# Patient Record
Sex: Male | Born: 1985 | Race: White | Hispanic: No | Marital: Single | Smoking: Former smoker
Health system: Southern US, Community
[De-identification: ages and names within clinical notes are randomized; demographics above are authoritative.]

## PROBLEM LIST (undated history)

## (undated) DIAGNOSIS — A630 Anogenital (venereal) warts: Secondary | ICD-10-CM

## (undated) DIAGNOSIS — F1911 Other psychoactive substance abuse, in remission: Secondary | ICD-10-CM

## (undated) DIAGNOSIS — B1920 Unspecified viral hepatitis C without hepatic coma: Secondary | ICD-10-CM

---

## 2011-08-31 HISTORY — PX: PERCUTANEOUS PINNING WRIST FRACTURE: SHX2211

## 2016-11-24 DIAGNOSIS — Z1159 Encounter for screening for other viral diseases: Secondary | ICD-10-CM | POA: Diagnosis not present

## 2016-11-24 DIAGNOSIS — R221 Localized swelling, mass and lump, neck: Secondary | ICD-10-CM | POA: Diagnosis not present

## 2016-12-16 DIAGNOSIS — R768 Other specified abnormal immunological findings in serum: Secondary | ICD-10-CM | POA: Diagnosis not present

## 2016-12-16 DIAGNOSIS — Z87898 Personal history of other specified conditions: Secondary | ICD-10-CM | POA: Diagnosis not present

## 2016-12-20 ENCOUNTER — Other Ambulatory Visit (HOSPITAL_COMMUNITY): Payer: Self-pay | Admitting: Gastroenterology

## 2016-12-20 DIAGNOSIS — B1920 Unspecified viral hepatitis C without hepatic coma: Secondary | ICD-10-CM

## 2017-01-05 ENCOUNTER — Ambulatory Visit (HOSPITAL_COMMUNITY)
Admission: RE | Admit: 2017-01-05 | Discharge: 2017-01-05 | Disposition: A | Discharge status: Home / Self Care | Payer: 59 | Source: Ambulatory Visit | Attending: Gastroenterology | Admitting: Gastroenterology

## 2017-01-05 DIAGNOSIS — R768 Other specified abnormal immunological findings in serum: Secondary | ICD-10-CM | POA: Insufficient documentation

## 2017-01-05 DIAGNOSIS — B1920 Unspecified viral hepatitis C without hepatic coma: Secondary | ICD-10-CM | POA: Diagnosis present

## 2017-01-05 DIAGNOSIS — R935 Abnormal findings on diagnostic imaging of other abdominal regions, including retroperitoneum: Secondary | ICD-10-CM | POA: Diagnosis not present

## 2017-01-12 DIAGNOSIS — Z87898 Personal history of other specified conditions: Secondary | ICD-10-CM | POA: Diagnosis not present

## 2017-01-27 ENCOUNTER — Other Ambulatory Visit: Payer: Self-pay | Admitting: General Surgery

## 2017-02-09 ENCOUNTER — Other Ambulatory Visit: Payer: Self-pay | Admitting: Urology

## 2017-02-28 ENCOUNTER — Encounter (HOSPITAL_BASED_OUTPATIENT_CLINIC_OR_DEPARTMENT_OTHER): Payer: Self-pay | Admitting: *Deleted

## 2017-02-28 NOTE — Progress Notes (Signed)
NPO AFTER MN.  ARRIVE AT 0600.  NEEDS HG.  

## 2017-03-11 ENCOUNTER — Encounter (HOSPITAL_BASED_OUTPATIENT_CLINIC_OR_DEPARTMENT_OTHER)
Admission: RE | Disposition: A | Discharge status: Home / Self Care | Payer: Self-pay | Source: Ambulatory Visit | Attending: General Surgery

## 2017-03-11 ENCOUNTER — Ambulatory Visit (HOSPITAL_BASED_OUTPATIENT_CLINIC_OR_DEPARTMENT_OTHER)
Admission: RE | Admit: 2017-03-11 | Discharge: 2017-03-11 | Disposition: A | Discharge status: Home / Self Care | Payer: 59 | Source: Ambulatory Visit | Attending: General Surgery | Admitting: General Surgery

## 2017-03-11 ENCOUNTER — Encounter (HOSPITAL_BASED_OUTPATIENT_CLINIC_OR_DEPARTMENT_OTHER): Payer: Self-pay | Admitting: *Deleted

## 2017-03-11 ENCOUNTER — Ambulatory Visit (HOSPITAL_BASED_OUTPATIENT_CLINIC_OR_DEPARTMENT_OTHER): Payer: 59 | Admitting: Anesthesiology

## 2017-03-11 DIAGNOSIS — K759 Inflammatory liver disease, unspecified: Secondary | ICD-10-CM | POA: Insufficient documentation

## 2017-03-11 DIAGNOSIS — Z833 Family history of diabetes mellitus: Secondary | ICD-10-CM | POA: Diagnosis not present

## 2017-03-11 DIAGNOSIS — A630 Anogenital (venereal) warts: Secondary | ICD-10-CM | POA: Diagnosis present

## 2017-03-11 DIAGNOSIS — Z87891 Personal history of nicotine dependence: Secondary | ICD-10-CM | POA: Insufficient documentation

## 2017-03-11 DIAGNOSIS — Z79899 Other long term (current) drug therapy: Secondary | ICD-10-CM | POA: Diagnosis not present

## 2017-03-11 DIAGNOSIS — Z808 Family history of malignant neoplasm of other organs or systems: Secondary | ICD-10-CM | POA: Diagnosis not present

## 2017-03-11 DIAGNOSIS — E669 Obesity, unspecified: Secondary | ICD-10-CM | POA: Insufficient documentation

## 2017-03-11 DIAGNOSIS — Z6831 Body mass index (BMI) 31.0-31.9, adult: Secondary | ICD-10-CM | POA: Insufficient documentation

## 2017-03-11 HISTORY — DX: Unspecified viral hepatitis C without hepatic coma: B19.20

## 2017-03-11 HISTORY — DX: Other psychoactive substance abuse, in remission: F19.11

## 2017-03-11 HISTORY — PX: CO2 LASER APPLICATION: SHX5778

## 2017-03-11 HISTORY — DX: Anogenital (venereal) warts: A63.0

## 2017-03-11 HISTORY — PX: LASER ABLATION CONDOLAMATA: SHX5941

## 2017-03-11 SURGERY — ABLATION, CONDYLOMA, USING LASER
Anesthesia: General | Site: Rectum

## 2017-03-11 MED ORDER — LACTATED RINGERS IV SOLN
INTRAVENOUS | Status: DC
  Administered 2017-03-11: 07:00:00 via INTRAVENOUS
  Filled 2017-03-11: qty 1000

## 2017-03-11 MED ORDER — SILVER SULFADIAZINE 1 % EX CREA
1.0000 | TOPICAL_CREAM | Freq: Every day | CUTANEOUS | 2 refills | Status: DC
Start: 2017-03-11 — End: 2020-07-23

## 2017-03-11 MED ORDER — MIDAZOLAM HCL 5 MG/5ML IJ SOLN
INTRAMUSCULAR | Status: DC | PRN
  Administered 2017-03-11: 2 mg via INTRAVENOUS

## 2017-03-11 MED ORDER — DEXAMETHASONE SODIUM PHOSPHATE 4 MG/ML IJ SOLN
INTRAMUSCULAR | Status: DC | PRN
  Administered 2017-03-11: 10 mg via INTRAVENOUS

## 2017-03-11 MED ORDER — ARTIFICIAL TEARS OPHTHALMIC OINT
TOPICAL_OINTMENT | OPHTHALMIC | Status: AC
  Filled 2017-03-11: qty 3.5

## 2017-03-11 MED ORDER — PROPOFOL 10 MG/ML IV BOLUS
INTRAVENOUS | Status: DC | PRN
  Administered 2017-03-11: 20 mg via INTRAVENOUS
  Administered 2017-03-11: 50 mg via INTRAVENOUS
  Administered 2017-03-11: 30 mg via INTRAVENOUS
  Administered 2017-03-11: 50 mg via INTRAVENOUS
  Administered 2017-03-11: 250 mg via INTRAVENOUS
  Administered 2017-03-11: 50 mg via INTRAVENOUS

## 2017-03-11 MED ORDER — ACETAMINOPHEN 650 MG RE SUPP
650.0000 mg | RECTAL | Status: DC | PRN
  Filled 2017-03-11: qty 1

## 2017-03-11 MED ORDER — MEPERIDINE HCL 25 MG/ML IJ SOLN
6.2500 mg | INTRAMUSCULAR | Status: DC | PRN
  Filled 2017-03-11: qty 1

## 2017-03-11 MED ORDER — HYDROMORPHONE HCL 1 MG/ML IJ SOLN
0.2500 mg | INTRAMUSCULAR | Status: DC | PRN
  Filled 2017-03-11: qty 0.5

## 2017-03-11 MED ORDER — FENTANYL CITRATE (PF) 100 MCG/2ML IJ SOLN
INTRAMUSCULAR | Status: AC
  Filled 2017-03-11: qty 2

## 2017-03-11 MED ORDER — ONDANSETRON HCL 4 MG/2ML IJ SOLN
INTRAMUSCULAR | Status: DC | PRN
  Administered 2017-03-11: 4 mg via INTRAVENOUS

## 2017-03-11 MED ORDER — KETOROLAC TROMETHAMINE 10 MG PO TABS: 10.0000 mg | ORAL_TABLET | Freq: Four times a day (QID) | ORAL | 0 refills | Status: DC | PRN

## 2017-03-11 MED ORDER — BUPIVACAINE LIPOSOME 1.3 % IJ SUSP
INTRAMUSCULAR | Status: DC | PRN
  Administered 2017-03-11: 20 mL

## 2017-03-11 MED ORDER — CEFAZOLIN SODIUM-DEXTROSE 2-4 GM/100ML-% IV SOLN
INTRAVENOUS | Status: AC
  Filled 2017-03-11: qty 100

## 2017-03-11 MED ORDER — KETOROLAC TROMETHAMINE 30 MG/ML IJ SOLN
INTRAMUSCULAR | Status: AC
  Filled 2017-03-11: qty 1

## 2017-03-11 MED ORDER — LIDOCAINE 2% (20 MG/ML) 5 ML SYRINGE
INTRAMUSCULAR | Status: DC | PRN
  Administered 2017-03-11: 100 mg via INTRAVENOUS

## 2017-03-11 MED ORDER — SODIUM CHLORIDE 0.9% FLUSH
3.0000 mL | Freq: Two times a day (BID) | INTRAVENOUS | Status: DC
  Filled 2017-03-11: qty 3

## 2017-03-11 MED ORDER — ACETAMINOPHEN 10 MG/ML IV SOLN
INTRAVENOUS | Status: DC | PRN
  Administered 2017-03-11: 1000 mg via INTRAVENOUS

## 2017-03-11 MED ORDER — SILVER SULFADIAZINE 1 % EX CREA
TOPICAL_CREAM | CUTANEOUS | Status: DC | PRN
  Administered 2017-03-11: 1 via TOPICAL

## 2017-03-11 MED ORDER — ACETIC ACID 5 % SOLN
Status: DC | PRN
  Administered 2017-03-11 (×3): 1 via TOPICAL

## 2017-03-11 MED ORDER — ACETAMINOPHEN 325 MG PO TABS
650.0000 mg | ORAL_TABLET | ORAL | Status: DC | PRN
  Filled 2017-03-11: qty 2

## 2017-03-11 MED ORDER — ONDANSETRON HCL 4 MG/2ML IJ SOLN
INTRAMUSCULAR | Status: AC
  Filled 2017-03-11: qty 2

## 2017-03-11 MED ORDER — KETOROLAC TROMETHAMINE 30 MG/ML IJ SOLN
30.0000 mg | Freq: Four times a day (QID) | INTRAMUSCULAR | Status: DC
  Filled 2017-03-11: qty 1

## 2017-03-11 MED ORDER — HYDROCODONE-ACETAMINOPHEN 7.5-325 MG PO TABS
1.0000 | ORAL_TABLET | Freq: Once | ORAL | Status: DC | PRN
  Filled 2017-03-11: qty 1

## 2017-03-11 MED ORDER — SODIUM CHLORIDE 0.9% FLUSH
3.0000 mL | INTRAVENOUS | Status: DC | PRN
  Filled 2017-03-11: qty 3

## 2017-03-11 MED ORDER — DEXAMETHASONE SODIUM PHOSPHATE 10 MG/ML IJ SOLN
INTRAMUSCULAR | Status: AC
  Filled 2017-03-11: qty 1

## 2017-03-11 MED ORDER — KETOROLAC TROMETHAMINE 30 MG/ML IJ SOLN
INTRAMUSCULAR | Status: DC | PRN
  Administered 2017-03-11: 30 mg via INTRAVENOUS

## 2017-03-11 MED ORDER — LIDOCAINE 2% (20 MG/ML) 5 ML SYRINGE
INTRAMUSCULAR | Status: AC
  Filled 2017-03-11: qty 5

## 2017-03-11 MED ORDER — MIDAZOLAM HCL 2 MG/2ML IJ SOLN
INTRAMUSCULAR | Status: AC
  Filled 2017-03-11: qty 2

## 2017-03-11 MED ORDER — ACETAMINOPHEN 10 MG/ML IV SOLN
INTRAVENOUS | Status: AC
  Filled 2017-03-11: qty 100

## 2017-03-11 MED ORDER — FENTANYL CITRATE (PF) 100 MCG/2ML IJ SOLN
INTRAMUSCULAR | Status: DC | PRN
Start: 2017-03-11 — End: 2017-03-11
  Administered 2017-03-11 (×4): 25 ug via INTRAVENOUS

## 2017-03-11 MED ORDER — BUPIVACAINE-EPINEPHRINE 0.5% -1:200000 IJ SOLN
INTRAMUSCULAR | Status: DC | PRN
  Administered 2017-03-11: 20 mL

## 2017-03-11 MED ORDER — SODIUM CHLORIDE 0.9 % IR SOLN
Status: DC | PRN
  Administered 2017-03-11: 500 mL

## 2017-03-11 MED ORDER — LIDOCAINE 5 % EX OINT: 1.0000 "application " | TOPICAL_OINTMENT | CUTANEOUS | 2 refills | Status: DC | PRN

## 2017-03-11 MED ORDER — SODIUM CHLORIDE 0.9 % IV SOLN
250.0000 mL | INTRAVENOUS | Status: DC | PRN
  Filled 2017-03-11: qty 250

## 2017-03-11 MED ORDER — STERILE WATER FOR IRRIGATION IR SOLN
Status: DC | PRN
  Administered 2017-03-11 (×2): 500 mL

## 2017-03-11 MED ORDER — CEFAZOLIN SODIUM-DEXTROSE 2-4 GM/100ML-% IV SOLN
2.0000 g | INTRAVENOUS | Status: AC
  Administered 2017-03-11: 2 g via INTRAVENOUS
  Filled 2017-03-11: qty 100

## 2017-03-11 MED ORDER — PROPOFOL 10 MG/ML IV BOLUS
INTRAVENOUS | Status: AC
  Filled 2017-03-11: qty 40

## 2017-03-11 MED ORDER — PROMETHAZINE HCL 25 MG/ML IJ SOLN
6.2500 mg | INTRAMUSCULAR | Status: DC | PRN
  Filled 2017-03-11: qty 1

## 2017-03-11 SURGICAL SUPPLY — 56 items
BLADE HEX COATED 2.75 (ELECTRODE) ×3 IMPLANT
BLADE SURG 15 STRL LF DISP TIS (BLADE) ×2 IMPLANT
BLADE SURG 15 STRL SS (BLADE) ×1
BRIEF STRETCH FOR OB PAD LRG (UNDERPADS AND DIAPERS) ×3 IMPLANT
CANISTER SUCT 3000ML PPV (MISCELLANEOUS) ×3 IMPLANT
COVER BACK TABLE 60X90IN (DRAPES) ×3 IMPLANT
COVER MAYO STAND STRL (DRAPES) IMPLANT
DECANTER SPIKE VIAL GLASS SM (MISCELLANEOUS) ×3 IMPLANT
DRAPE HALF SHEET 40X57 (DRAPES) ×3 IMPLANT
DRAPE LAPAROTOMY 100X72 PEDS (DRAPES) IMPLANT
DRAPE LG THREE QUARTER DISP (DRAPES) IMPLANT
DRAPE UNDER BUTCK 40X44W/POUCH (DRAPES) ×3 IMPLANT
DRAPE UTILITY 15X26 (DRAPE) ×3 IMPLANT
DRAPE UTILITY XL STRL (DRAPES) ×3 IMPLANT
DRSG PAD ABDOMINAL 8X10 ST (GAUZE/BANDAGES/DRESSINGS) ×3 IMPLANT
ELECT REM PT RETURN 9FT ADLT (ELECTROSURGICAL) ×3
ELECTRODE REM PT RTRN 9FT ADLT (ELECTROSURGICAL) ×2 IMPLANT
GAUZE SPONGE 4X4 16PLY XRAY LF (GAUZE/BANDAGES/DRESSINGS) IMPLANT
GAUZE VASELINE 3X9 (GAUZE/BANDAGES/DRESSINGS) IMPLANT
GLOVE BIO SURGEON STRL SZ 6.5 (GLOVE) ×3 IMPLANT
GLOVE BIO SURGEON STRL SZ7 (GLOVE) ×3 IMPLANT
GLOVE BIO SURGEON STRL SZ7.5 (GLOVE) ×3 IMPLANT
GLOVE BIOGEL PI IND STRL 7.0 (GLOVE) ×2 IMPLANT
GLOVE BIOGEL PI INDICATOR 7.0 (GLOVE) ×1
GOWN SPEC L3 XXLG W/TWL (GOWN DISPOSABLE) ×3 IMPLANT
GOWN STRL REUS W/ TWL LRG LVL3 (GOWN DISPOSABLE) ×2 IMPLANT
GOWN STRL REUS W/ TWL XL LVL3 (GOWN DISPOSABLE) ×4 IMPLANT
GOWN STRL REUS W/TWL LRG LVL3 (GOWN DISPOSABLE) ×1
GOWN STRL REUS W/TWL XL LVL3 (GOWN DISPOSABLE) ×5 IMPLANT
KIT RM TURNOVER CYSTO AR (KITS) ×3 IMPLANT
LEGGING LITHOTOMY PAIR STRL (DRAPES) ×3 IMPLANT
MANIFOLD NEPTUNE II (INSTRUMENTS) IMPLANT
NDL SAFETY ECLIPSE 18X1.5 (NEEDLE) IMPLANT
NEEDLE HYPO 18GX1.5 SHARP (NEEDLE)
NEEDLE HYPO 22GX1.5 SAFETY (NEEDLE) ×3 IMPLANT
NEEDLE HYPO 25X1 1.5 SAFETY (NEEDLE) ×3 IMPLANT
NS IRRIG 500ML POUR BTL (IV SOLUTION) ×3 IMPLANT
PACK BASIN DAY SURGERY FS (CUSTOM PROCEDURE TRAY) ×3 IMPLANT
PAD ABD 8X10 STRL (GAUZE/BANDAGES/DRESSINGS) ×3 IMPLANT
PAD ARMBOARD 7.5X6 YLW CONV (MISCELLANEOUS) IMPLANT
PENCIL BUTTON HOLSTER BLD 10FT (ELECTRODE) ×3 IMPLANT
SPONGE GAUZE 4X4 12PLY (GAUZE/BANDAGES/DRESSINGS) IMPLANT
SPONGE SURGIFOAM ABS GEL 12-7 (HEMOSTASIS) IMPLANT
SUT CHROMIC 2 0 SH (SUTURE) IMPLANT
SUT CHROMIC 3 0 SH 27 (SUTURE) IMPLANT
SUT MON AB 3-0 SH 27 (SUTURE)
SUT MON AB 3-0 SH27 (SUTURE) IMPLANT
SUT VIC AB 4-0 P-3 18XBRD (SUTURE) IMPLANT
SUT VIC AB 4-0 P3 18 (SUTURE)
SYR CONTROL 10ML LL (SYRINGE) ×3 IMPLANT
TOWEL OR 17X24 6PK STRL BLUE (TOWEL DISPOSABLE) ×6 IMPLANT
TRAY DSU PREP LF (CUSTOM PROCEDURE TRAY) ×3 IMPLANT
TUBE CONNECTING 12X1/4 (SUCTIONS) ×3 IMPLANT
VACUUM HOSE 7/8X10 W/ WAND (MISCELLANEOUS) ×3 IMPLANT
WATER STERILE IRR 500ML POUR (IV SOLUTION) ×6 IMPLANT
YANKAUER SUCT BULB TIP NO VENT (SUCTIONS) ×3 IMPLANT

## 2017-03-11 NOTE — Op Note (Signed)
03/11/2017  8:36 AM  PATIENT:  Charles Kim  31 y.o. male  Patient Care Team: Pa, [place] as PCP - General (Family Medicine)  PRE-OPERATIVE DIAGNOSIS:  anal condyloma  POST-OPERATIVE DIAGNOSIS:  anal condyloma  PROCEDURE:   LASER ABLATION ANAL CONDYLOMA     Surgeon(s): Leighton Ruff, MD   ASSISTANT: none   ANESTHESIA:   local and MAC  SPECIMEN:  No Specimen  DISPOSITION OF SPECIMEN:  N/A  COUNTS:  YES  PLAN OF CARE: Discharge to home after PACU  PATIENT DISPOSITION:  PACU - hemodynamically stable.  INDICATION: 31 y.o. M with perineal condyloma   OR FINDINGS: carpeting lesions, bilateral perianal region, no internal lesions noted  DESCRIPTION: the patient was identified in the preoperative holding area and taken to the OR where they were laid on the operating room table.  MAC anesthesia was induced without difficulty. The patient was then positioned in prone jackknife position with buttocks gently taped apart.  The patient was then prepped and draped in usual sterile fashion.  SCDs were noted to be in place prior to the initiation of anesthesia. A surgical timeout was performed indicating the correct patient, procedure, positioning and need for preoperative antibiotics.  A rectal block was performed using Marcaine with epinephrine.    I began with a digital rectal exam.  There were no abnormal masses.  I then placed a Hill-Ferguson anoscope into the anal canal and evaluated this completely.  No lesions were noted internally.  The laser was then activated and the patient was appropriately draped.  The laser was used the ablate the condylomatous tissue circumferentially.  I confirmed condyloma with acetic acid.  Once all lesions were ablated, a field block was created using Marcaine and Exparel.   I then turned over the case to Dr Karsten Ro to complete the anterior portion.

## 2017-03-11 NOTE — H&P (Signed)
History of Present Illness  The patient is a 31 year old male who presents with a complaint of anal problems. 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 He reports these have been present for several years dating back to his time in prison. All other STD testing has been negative. He occasionally has some itching but no other sites have been giving him problems. He has had several lesions frozen off in the past by his dermatologist but they're unable to keep up with his extent of disease.   Past Surgical History Timmothy Euler, [place]; 11/22/2016 2:38 PM) No pertinent past surgical history   Diagnostic Studies History Timmothy Euler, [place]; 11/22/2016 2:38 PM) Colonoscopy  never  Allergies Barron Alvine [place], [place]; 11/22/2016 2:39 PM) No Known Drug Allergies 11/22/2016 Allergies Reconciled   Medication History Timmothy Euler, [place]; 11/22/2016 2:39 PM) No Current Medications Medications Reconciled  Social History Timmothy Euler, [place]; 11/22/2016 2:38 PM) Alcohol use  Remotely quit alcohol use. Caffeine use  Coffee. Illicit drug use  Remotely quit drug use. Tobacco use  Former smoker.  Family History Timmothy Euler, [place]; 11/22/2016 2:38 PM) Diabetes Mellitus  Father. Melanoma  Father, Mother.  Other Problems Timmothy Euler, [place]; 11/22/2016 2:38 PM) Hepatitis     Review of Systems  General Not Present- Appetite Loss, Chills, Fatigue, Fever, Night Sweats, Weight Gain and Weight Loss. Skin Not Present- Change in Wart/Mole, Dryness, Hives, Jaundice, New Lesions, Non-Healing Wounds, Rash and Ulcer. HEENT Not Present- Earache, Hearing Loss, Hoarseness, Nose Bleed, Oral Ulcers, Ringing in the Ears, Seasonal Allergies, Sinus Pain, Sore Throat, Visual Disturbances, Wears glasses/contact lenses and Yellow Eyes. Respiratory Not Present- Bloody sputum, Chronic Cough, Difficulty Breathing, Snoring and Wheezing. Breast Not Present- Breast Mass, Breast  Pain, Nipple Discharge and Skin Changes. Cardiovascular Not Present- Chest Pain, Difficulty Breathing Lying Down, Leg Cramps, Palpitations, Rapid Heart Rate, Shortness of Breath and Swelling of Extremities. Gastrointestinal Not Present- Abdominal Pain, Bloating, Bloody Stool, Change in Bowel Habits, Chronic diarrhea, Constipation, Difficulty Swallowing, Excessive gas, Gets full quickly at meals, Hemorrhoids, Indigestion, Nausea, Rectal Pain and Vomiting. Male Genitourinary Not Present- Blood in Urine, Change in Urinary Stream, Frequency, Impotence, Nocturia, Painful Urination, Urgency and Urine Leakage. Neurological Present- Headaches. Not Present- Decreased Memory, Fainting, Numbness, Seizures, Tingling, Tremor, Trouble walking and Weakness. Psychiatric Not Present- Anxiety, Bipolar, Change in Sleep Pattern, Depression, Fearful and Frequent crying. Endocrine Not Present- Cold Intolerance, Excessive Hunger, Hair Changes, Heat Intolerance, Hot flashes and New Diabetes. Hematology Not Present- Blood Thinners, Easy Bruising, Excessive bleeding, Gland problems, HIV and Persistent Infections.  BP 128/68   Pulse 61   Temp 97.8 F (36.6 C) (Oral)   Resp 16   Ht 5\' 8"  (1.727 m)   Wt 92.5 kg (204 lb)   SpO2 98%   BMI 31.02 kg/m     Physical Exam  General Mental Status-Alert. General Appearance-Not in acute distress. Build & Nutrition-Well nourished. Posture-Normal posture. Gait-Normal.  Head and Neck Head-normocephalic, atraumatic with no lesions or palpable masses. Trachea-midline.  Chest and Lung Exam Chest and lung exam reveals -on auscultation, normal breath sounds, no adventitious sounds and normal vocal resonance.  Cardiovascular Cardiovascular examination reveals -normal heart sounds, regular rate and rhythm with no murmurs and no digital clubbing, cyanosis, edema, increased warmth or tenderness.  Abdomen Inspection Inspection of the abdomen reveals - No  Hernias. Palpation/Percussion Palpation and Percussion of the abdomen reveal - Soft, Non Tender, No Rigidity (guarding), No hepatosplenomegaly and No Palpable abdominal masses.  Male Genitourinary Penis -Note: Multiple small condyloma noted around the shaft of the penis. There are larger lesions noted in the left groin and on the mons pubis.   Neurologic Neurologic evaluation reveals -alert and oriented x 3 with no impairment of recent or remote memory, normal attention span and ability to concentrate, normal sensation and normal coordination.  Musculoskeletal Normal Exam - Bilateral-Upper Extremity Strength Normal and Lower Extremity Strength Normal.    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 On exam he has carpeting lesions on the lateral perianal regions. He has no internal disease. He also has several large lesions in the left groin and lesions on the mons pubis. I have had him evaluated by the urologist as well and they have agreed that further surgical treatment needs to be performed on the penile lesions.  We will perform a joint operation.  We discussed the typical postop recovery and pain. We discussed the risk of recurrence.  Pt would like to avoid post op narcotics due to h/o abuse.

## 2017-03-11 NOTE — Op Note (Signed)
PATIENT:  Charles Kim  PRE-OPERATIVE DIAGNOSIS: 1. Left groin condyloma 2. Suprapubic condyloma 3. Penile condyloma 4. Scrotal condyloma  POST-OPERATIVE DIAGNOSIS: Same  PROCEDURE: CO2 laser ablation of all above noted condyloma (15 lesions 5-10 mm, one lesion 40 mm, one lesion 30 mm, one lesion 20 mm)  SURGEON:  Garnett FarmMark C Danne Vasek  INDICATION: Charles Kim is a 31 year old male with extensive suprapubic, groin, penile, scrotal and perianal condyloma. Due to the extensive nature of the lesions he has elected to proceed with CO2 laser ablation of the lesions.  ANESTHESIA:  General  EBL:  Minimal  DRAINS: None  LOCAL MEDICATIONS USED:  1/2% Marcaine  SPECIMEN:  None  Description of procedure: After informed consent the patient was taken to the operating room and placed on the table in a supine position. General anesthesia was then administered. Once fully anesthetized the patient was moved to the dorsal lithotomy position and the genitalia as well as perineum and anus were sterilely prepped and draped in standard fashion. An official timeout was then performed. Dr. Maisie Fushomas treated his perineal and perianal condyloma with the CO2 laser and this will be dictated by her separately.  A second timeout was performed and I proceeded to begin treatment of his condyloma starting with the penile condyloma using the CO2 laser set at 10 W. All lesions on the penis were obliterated with the laser. I then wrapped the penis with 5% acetic acid solution and turned my attention to the scrotum where several lesions were identified and obliterated. The multiple lesions in the suprapubic region were then treated individually and finally my attention was directed to the very large lesions within the thigh crease anteriorly on the left-hand side. I had to increase the laser setting to 20 W initially to treat the large lesions down to the level of the skin and then reduce the power back down to 10 W. I was able to  completely treat all of these visible lesions. I then went back to the penis and scrotum where the acetic acid solution had been present and this did allow me to find several acetowhite lesions which were treated with the laser as well. I injected local anesthetic in the subcutaneous tissue beneath the largest lesions in the left thigh crease. Silvadene cream was then applied to all of the areas treated and a dry dressing and mesh briefs were applied and the patient was awakened and taken to the recovery room in stable and satisfactory condition. He tolerated the procedure well no intraoperative complications.    PLAN OF CARE: Discharge to home after PACU  PATIENT DISPOSITION:  PACU - hemodynamically stable.

## 2017-03-11 NOTE — Addendum Note (Signed)
Addendum  created 03/11/17 1021 by Tyrone NineSauve, Naiomi Musto F, CRNA   Anesthesia Staff edited

## 2017-03-11 NOTE — Discharge Instructions (Addendum)
Post Operative Instructions Following Laser Surgery  Laser treatment of condyloma (warts) is used to vaporize or eliminate the wart.  The laser actually creates a burn effect on the skin to accomplish this.  The following instructions will help in you comfort postoperatively:  First 24 h  Remove the dressing this evening after you return home from the hospital  Sit in a tub or sitz bath of COOL water for 20 minutes every hour while awake.  After the bath, carefully blot the area dry and place a piece of 100% Cotton with Silvadene on it next to the anal opening.  You may sit on a covered ice pack between the baths as needed.    You should have crushed ice in small amounts until you are able to urinate.  After you urinate, you may drink fluids.  It is normal to not urinate for the first few hours after surgery.  If you become uncomfortable, call the office for instructions.   Take your pain medications as prescribed  You may eat whatever you feel like.  Start with a light meal and gradually advance your diet.    Beginning the day after your surgery  Sit in a tub of cool to warm water for 15 minutes at least 3 times a day and after bowel movements  After the bowel movement, clean with wet cotton, Tucks or baby wipes.  Apply Silvadene to a thin piece of cotton and place over the anal opening after your baths.  This will collect any drainage or bleeding.  Drainage is usually a pink to tan color and is normal for the following 2-3 weeks after surgery.  Change to cotton ever 2-3 hours while awake.  Diet  Eat a regular diet.  Avoid foods that may constipate you or give you diarrhea.  Avoid foods with seeds, nuts, corn or popcorn.  Beginning the day after surgery, drink 6-8 glasses of water a day in addition to your meals.  Limit you caffeine intake to 1-2 servings per day.  Medication  Take a fiber supplement (Metamucil, Citrucel, FiberCon) twice a day.  Take a stool softener like Colace  twice daily  Take your pain medication as directed.  You may use Extra Strength Tylenol instead of your prescribed pain medication to relieve mild discomfort.  Avoid products containing aspirin.  Bowel Habits  You should have a bowel movement at least every other day.  If you are constipated, you may take a Fleet enema or 2 Dulcolax tablets.  Call the office if no results occur.  You may bear down a normal amount to have a bowel movement without hurting your tissues after the operation.  Activity  Walking is encouraged.  You may drive when you are no longer on prescription pain medication.  You may go up and down stairs carefully.  No heavy lifting or strenuous activity until after your first post operative appointment  Do NOT sit on a rubber ring; instead use a soft pillow if needed.  Call the office if you have any questions or concerns.  Call IMMEDIATELY if you develop:  Rectal bleeding  Increasing rectal pain  Fever greater than 100 F  Difficulty urinating    Post Anesthesia Home Care Instructions  Activity: Get plenty of rest for the remainder of the day. A responsible individual must stay with you for 24 hours following the procedure.  For the next 24 hours, DO NOT: -Drive a car -Advertising copywriter -Drink alcoholic beverages -Take any medication  unless instructed by your physician -Make any legal decisions or sign important papers.  Meals: Start with liquid foods such as gelatin or soup. Progress to regular foods as tolerated. Avoid greasy, spicy, heavy foods. If nausea and/or vomiting occur, drink only clear liquids until the nausea and/or vomiting subsides. Call your physician if vomiting continues.  Special Instructions/Symptoms: Your throat may feel dry or sore from the anesthesia or the breathing tube placed in your throat during surgery. If this causes discomfort, gargle with warm salt water. The discomfort should disappear within 24 hours.  If you had a  scopolamine patch placed behind your ear for the management of post- operative nausea and/or vomiting:  1. The medication in the patch is effective for 72 hours, after which it should be removed.  Wrap patch in a tissue and discard in the trash. Wash hands thoroughly with soap and water. 2. You may remove the patch earlier than 72 hours if you experience unpleasant side effects which may include dry mouth, dizziness or visual disturbances. 3. Avoid touching the patch. Wash your hands with soap and water after contact with the patch.   Information for Discharge Teaching: EXPAREL (bupivacaine liposome injectable suspension)   Your surgeon gave you EXPAREL(bupivacaine) in your surgical incision to help control your pain after surgery.   EXPAREL is a local anesthetic that provides pain relief by numbing the tissue around the surgical site.  EXPAREL is designed to release pain medication over time and can control pain for up to 72 hours.  Depending on how you respond to EXPAREL, you may require less pain medication during your recovery.  Possible side effects:  Temporary loss of sensation or ability to move in the area where bupivacaine was injected.  Nausea, vomiting, constipation  Rarely, numbness and tingling in your mouth or lips, lightheadedness, or anxiety may occur.  Call your doctor right away if you think you may be experiencing any of these sensations, or if you have other questions regarding possible side effects.  Follow all other discharge instructions given to you by your surgeon or nurse. Eat a healthy diet and drink plenty of water or other fluids.  If you return to the hospital for any reason within 96 hours following the administration of EXPAREL, please inform your health care providers.

## 2017-03-11 NOTE — Anesthesia Procedure Notes (Signed)
Procedure Name: LMA Insertion Date/Time: 03/11/2017 7:40 AM Performed by: Mal AmabileFOSTER, MICHAEL Pre-anesthesia Checklist: Patient identified, Emergency Drugs available, Suction available and Patient being monitored Patient Re-evaluated:Patient Re-evaluated prior to induction Oxygen Delivery Method: Circle system utilized Preoxygenation: Pre-oxygenation with 100% oxygen Induction Type: IV induction Ventilation: Mask ventilation without difficulty LMA: LMA inserted LMA Size: 5.0 Number of attempts: 1 Airway Equipment and Method: Bite block Placement Confirmation: positive ETCO2 Tube secured with: Tape Dental Injury: Teeth and Oropharynx as per pre-operative assessment

## 2017-03-11 NOTE — Anesthesia Preprocedure Evaluation (Signed)
Anesthesia Evaluation  Patient identified by MRN, date of birth, ID band Patient awake    Reviewed: Allergy & Precautions, NPO status , Patient's Chart, lab work & pertinent test results  Airway Mallampati: II  TM Distance: >3 FB Neck ROM: Full    Dental  (+) Poor Dentition   Pulmonary former smoker,    Pulmonary exam normal breath sounds clear to auscultation       Cardiovascular negative cardio ROS Normal cardiovascular exam Rhythm:Regular Rate:Normal     Neuro/Psych negative neurological ROS  negative psych ROS   GI/Hepatic (+)     substance abuse  , Hepatitis -, CHx/o IVDA Anal condyloma   Endo/Other  Obesity  Renal/GU negative Renal ROS  negative genitourinary   Musculoskeletal negative musculoskeletal ROS (+)   Abdominal (+) + obese,   Peds  Hematology negative hematology ROS (+)   Anesthesia Other Findings   Reproductive/Obstetrics                             Anesthesia Physical Anesthesia Plan  ASA: II  Anesthesia Plan: General   Post-op Pain Management:    Induction: Intravenous  PONV Risk Score and Plan: 3 and Ondansetron, Dexamethasone, Propofol and Midazolam  Airway Management Planned: LMA  Additional Equipment:   Intra-op Plan:   Post-operative Plan: Extubation in OR  Informed Consent: I have reviewed the patients History and Physical, chart, labs and discussed the procedure including the risks, benefits and alternatives for the proposed anesthesia with the patient or authorized representative who has indicated his/her understanding and acceptance.   Dental advisory given  Plan Discussed with: CRNA, Anesthesiologist and Surgeon  Anesthesia Plan Comments:         Anesthesia Quick Evaluation

## 2017-03-11 NOTE — H&P (Signed)
HPI: Charles Kim is a 31 year-old male patient with genital condyloma.  He is not having pain. His symptoms have gotten worse over the last year.   He has not undergone surgery for treatment. He was circumcised at birth.   He is not currently having trouble urinating. He is not having problems getting his urine stream started. He does not have a split stream when he urinates.   01/17/17: He is seen today for evaluation of penile condyloma. He has been found to have extensive anal/perineal as well as groin condyloma. They have been treated in the past by dermatologist. And he has also had the smaller ones frozen off at a health clinic. For 2 years he was incarcerated and underwent no treatment and may have enlarged and increased in number.     ALLERGIES: None   MEDICATIONS: None   GU PSH: None   NON-GU PSH: None   GU PMH: None   NON-GU PMH: Hepatitis C    FAMILY HISTORY: None   SOCIAL HISTORY: Marital Status: Single Current Smoking Status: Patient does not smoke anymore.   Tobacco Use Assessment Completed: Used Tobacco in last 30 days? Has never drank.  Drinks 2 caffeinated drinks per day.    REVIEW OF SYSTEMS:    GU Review Male:   Patient denies frequent urination, hard to postpone urination, burning/ pain with urination, get up at night to urinate, leakage of urine, stream starts and stops, trouble starting your stream, have to strain to urinate , erection problems, and penile pain.  Gastrointestinal (Upper):   Patient denies nausea, vomiting, and indigestion/ heartburn.  Gastrointestinal (Lower):   Patient denies diarrhea and constipation.  Constitutional:   Patient denies fever, night sweats, weight loss, and fatigue.  Skin:   Patient reports itching. Patient denies skin rash/ lesion.  Eyes:   Patient denies double vision and blurred vision.  Ears/ Nose/ Throat:   Patient denies sore throat and sinus problems.  Hematologic/Lymphatic:   Patient denies swollen glands and  easy bruising.  Cardiovascular:   Patient denies leg swelling and chest pains.  Respiratory:   Patient denies cough and shortness of breath.  Endocrine:   Patient denies excessive thirst.  Musculoskeletal:   Patient denies back pain and joint pain.  Neurological:   Patient denies headaches and dizziness.  Psychologic:   Patient denies depression and anxiety.   VITAL SIGNS:    Weight 203 lb / 92.08 kg  Height 68 in / 172.72 cm  BP 133/77 mmHg  Pulse 70 /min  BMI 30.9 kg/m   GU PHYSICAL EXAMINATION:    Scrotum: No lesions. No edema. No cysts. No warts.  Epididymides: Right: no spermatocele, no masses, no cysts, no tenderness, no induration, no enlargement. Left: no spermatocele, no masses, no cysts, no tenderness, no induration, no enlargement.  Testes: No tenderness, no swelling, no enlargement left testes. No tenderness, no swelling, no enlargement right testes. Normal location left testes. Normal location right testes. No mass, no cyst, no varicocele, no hydrocele left testes. No mass, no cyst, no varicocele, no hydrocele right testes.  Urethral Meatus: Normal size. No lesion, no wart, no discharge, no polyp. Normal location.  Penis: Penile shaft > 5 penile warts. Circumcised, no foreskin warts, no cracks. No dorsal peyronie's plaques, no left corporal peyronie's plaques, no right corporal peyronie's plaques, no scarring. No balanitis, no meatal stenosis.    Notes: He has some very large condyloma located in the groin crease on the left-hand side. There is  some on the right and he has multiple small areas in the suprapubic region.   MULTI-SYSTEM PHYSICAL EXAMINATION:    Constitutional: Well-nourished. No physical deformities. Normally developed. Good grooming.  Neck: Neck symmetrical, not swollen. Normal tracheal position.  Respiratory: No labored breathing, no use of accessory muscles.   Cardiovascular: Normal temperature, normal extremity pulses, no swelling, no varicosities.   Lymphatic: No enlargement of neck, axillae, groin.  Skin: No paleness, no jaundice, no cyanosis. No lesion, no ulcer, no rash.  Neurologic / Psychiatric: Oriented to time, oriented to place, oriented to person. No depression, no anxiety, no agitation.  Gastrointestinal: No mass, no tenderness, no rigidity, non obese abdomen.  Eyes: Normal conjunctivae. Normal eyelids.  Ears, Nose, Mouth, and Throat: Left ear no scars, no lesions, no masses. Right ear no scars, no lesions, no masses. Nose no scars, no lesions, no masses. Normal hearing. Normal lips.  Musculoskeletal: Normal gait and station of head and neck.     PAST DATA REVIEWED:  Source Of History:  Patient  Records Review:   Previous Doctor Records, POC Tool   PROCEDURES:          Urinalysis Dipstick Dipstick Cont'd  Color: Amber Bilirubin: Neg  Appearance: Clear Ketones: Neg  Specific Gravity: 1.015 Blood: Neg  pH: 8.5 Protein: Trace  Glucose: Neg Urobilinogen: 0.2    Nitrites: Neg    Leukocyte Esterase: Neg    ASSESSMENT/PLAN      We discussed the fact that while the small ones could be treated with cryotherapy the larger ones would require either resection or laser. I told him that if we were going to go ahead and address his groin and suprapubic lesions I would address all of these at one setting using the CO2 laser. We did discuss the procedure in detail and the fact that I would do acetowhite testing and could potentially find other areas of HPV within the skin that had not caused warts yet and would treat those areas as well. We discussed the outpatient nature of the procedure as well as the risks and complications and alternatives. We discussed the probability of success and the understanding that the virus could be present in some areas of the skin and not detectable leading to recurrence. Finally I told him that this would be coordinated with Dr. Maisie Fus so we could address all of the lesions under 1 general anesthetic.

## 2017-03-11 NOTE — Anesthesia Postprocedure Evaluation (Signed)
Anesthesia Post Note  Patient: Charles Kim  Procedure(s) Performed: Procedure(s) (LRB): LASER ABLATION CONDYLOMA (N/A) CO2 LASER TREATMENT OF CONDYLOMA (N/A)     Patient location during evaluation: PACU Anesthesia Type: General Level of consciousness: awake and alert and oriented Pain management: pain level controlled Vital Signs Assessment: post-procedure vital signs reviewed and stable Respiratory status: spontaneous breathing, nonlabored ventilation and respiratory function stable Cardiovascular status: blood pressure returned to baseline and stable Postop Assessment: no signs of nausea or vomiting Anesthetic complications: no    Last Vitals:  Vitals:   03/11/17 0930 03/11/17 0945  BP: 133/76 133/88  Pulse: 85 71  Resp: (!) 21 18  Temp:      Last Pain:  Vitals:   03/11/17 0611  TempSrc: Oral                 Lyell Clugston A.

## 2017-03-11 NOTE — Transfer of Care (Signed)
    Last Vitals:  Vitals:   03/11/17 0611 03/11/17 0921  BP: 128/68   Pulse: 61   Resp: 16   Temp: 36.6 C 36.7 C    Last Pain:  Vitals:   03/11/17 0611  TempSrc: Oral      Patients Stated Pain Goal: 10 (03/11/17 [id number])  Immediate Anesthesia Transfer of Care Note  Patient: Charles Kim  Procedure(s) Performed: Procedure(s) (LRB): LASER ABLATION CONDYLOMA (N/A) CO2 LASER TREATMENT OF CONDYLOMA (N/A)  Patient Location: PACU  Anesthesia Type: General  Level of Consciousness: awake, alert  and oriented  Airway & Oxygen Therapy: Patient Spontanous Breathing and Patient connected to nasal cannula oxygen  Post-op Assessment: Report given to PACU RN and Post -op Vital signs reviewed and stable  Post vital signs: Reviewed and stable  Complications: No apparent anesthesia complications

## 2017-03-14 ENCOUNTER — Encounter (HOSPITAL_BASED_OUTPATIENT_CLINIC_OR_DEPARTMENT_OTHER): Payer: Self-pay | Admitting: General Surgery

## 2017-03-14 LAB — POCT HEMOGLOBIN-HEMACUE: Hemoglobin: 15.5 g/dL (ref 13.0–17.0)

## 2017-07-07 DIAGNOSIS — Z8619 Personal history of other infectious and parasitic diseases: Secondary | ICD-10-CM | POA: Diagnosis not present

## 2017-07-07 DIAGNOSIS — K740 Hepatic fibrosis: Secondary | ICD-10-CM | POA: Diagnosis not present

## 2017-12-20 DIAGNOSIS — L738 Other specified follicular disorders: Secondary | ICD-10-CM | POA: Diagnosis not present

## 2017-12-20 DIAGNOSIS — L0889 Other specified local infections of the skin and subcutaneous tissue: Secondary | ICD-10-CM | POA: Diagnosis not present

## 2018-01-17 DIAGNOSIS — K740 Hepatic fibrosis: Secondary | ICD-10-CM | POA: Diagnosis not present

## 2018-01-17 DIAGNOSIS — Z8619 Personal history of other infectious and parasitic diseases: Secondary | ICD-10-CM | POA: Diagnosis not present

## 2018-01-19 ENCOUNTER — Other Ambulatory Visit (HOSPITAL_COMMUNITY): Payer: Self-pay | Admitting: Gastroenterology

## 2018-01-19 DIAGNOSIS — K7400 Hepatic fibrosis, unspecified: Secondary | ICD-10-CM

## 2018-01-25 ENCOUNTER — Ambulatory Visit (HOSPITAL_COMMUNITY)
Admission: RE | Admit: 2018-01-25 | Discharge: 2018-01-25 | Disposition: A | Discharge status: Home / Self Care | Payer: 59 | Source: Ambulatory Visit | Attending: Gastroenterology | Admitting: Gastroenterology

## 2018-01-25 DIAGNOSIS — K7400 Hepatic fibrosis, unspecified: Secondary | ICD-10-CM

## 2018-09-26 DIAGNOSIS — J0100 Acute maxillary sinusitis, unspecified: Secondary | ICD-10-CM | POA: Diagnosis not present

## 2018-09-26 DIAGNOSIS — Z23 Encounter for immunization: Secondary | ICD-10-CM | POA: Diagnosis not present

## 2018-11-02 DIAGNOSIS — K4090 Unilateral inguinal hernia, without obstruction or gangrene, not specified as recurrent: Secondary | ICD-10-CM | POA: Diagnosis not present

## 2020-07-23 ENCOUNTER — Emergency Department (HOSPITAL_BASED_OUTPATIENT_CLINIC_OR_DEPARTMENT_OTHER)
Admission: EM | Admit: 2020-07-23 | Discharge: 2020-07-23 | Disposition: A | Discharge status: Home / Self Care | Payer: 59 | Attending: Emergency Medicine | Admitting: Emergency Medicine

## 2020-07-23 ENCOUNTER — Other Ambulatory Visit (HOSPITAL_BASED_OUTPATIENT_CLINIC_OR_DEPARTMENT_OTHER): Payer: Self-pay | Admitting: Emergency Medicine

## 2020-07-23 ENCOUNTER — Encounter (HOSPITAL_BASED_OUTPATIENT_CLINIC_OR_DEPARTMENT_OTHER): Payer: Self-pay | Admitting: Emergency Medicine

## 2020-07-23 ENCOUNTER — Emergency Department (HOSPITAL_BASED_OUTPATIENT_CLINIC_OR_DEPARTMENT_OTHER): Payer: 59

## 2020-07-23 ENCOUNTER — Other Ambulatory Visit: Payer: Self-pay

## 2020-07-23 DIAGNOSIS — J36 Peritonsillar abscess: Secondary | ICD-10-CM

## 2020-07-23 DIAGNOSIS — Z20822 Contact with and (suspected) exposure to covid-19: Secondary | ICD-10-CM | POA: Diagnosis not present

## 2020-07-23 DIAGNOSIS — Z87891 Personal history of nicotine dependence: Secondary | ICD-10-CM | POA: Insufficient documentation

## 2020-07-23 DIAGNOSIS — R070 Pain in throat: Secondary | ICD-10-CM | POA: Diagnosis present

## 2020-07-23 LAB — CBC WITH DIFFERENTIAL/PLATELET
Abs Immature Granulocytes: 0.04 10*3/uL (ref 0.00–0.07)
Basophils Absolute: 0 10*3/uL (ref 0.0–0.1)
Basophils Relative: 0 %
Eosinophils Absolute: 0.1 10*3/uL (ref 0.0–0.5)
Eosinophils Relative: 1 %
HCT: 41.7 % (ref 39.0–52.0)
Hemoglobin: 14.7 g/dL (ref 13.0–17.0)
Immature Granulocytes: 0 %
Lymphocytes Relative: 10 %
Lymphs Abs: 1.2 10*3/uL (ref 0.7–4.0)
MCH: 31.1 pg (ref 26.0–34.0)
MCHC: 35.3 g/dL (ref 30.0–36.0)
MCV: 88.2 fL (ref 80.0–100.0)
Monocytes Absolute: 1.8 10*3/uL — ABNORMAL HIGH (ref 0.1–1.0)
Monocytes Relative: 15 %
Neutro Abs: 9 10*3/uL — ABNORMAL HIGH (ref 1.7–7.7)
Neutrophils Relative %: 74 %
Platelets: 184 10*3/uL (ref 150–400)
RBC: 4.73 MIL/uL (ref 4.22–5.81)
RDW: 12.2 % (ref 11.5–15.5)
WBC: 12.1 10*3/uL — ABNORMAL HIGH (ref 4.0–10.5)
nRBC: 0 % (ref 0.0–0.2)

## 2020-07-23 LAB — BASIC METABOLIC PANEL
Anion gap: 10 (ref 5–15)
BUN: 12 mg/dL (ref 6–20)
CO2: 25 mmol/L (ref 22–32)
Calcium: 9 mg/dL (ref 8.9–10.3)
Chloride: 102 mmol/L (ref 98–111)
Creatinine, Ser: 0.74 mg/dL (ref 0.61–1.24)
GFR, Estimated: >60 mL/min (ref >60)
Glucose, Bld: 113 mg/dL — ABNORMAL HIGH (ref 70–99)
Potassium: 3.9 mmol/L (ref 3.5–5.1)
Sodium: 137 mmol/L (ref 135–145)

## 2020-07-23 LAB — GROUP A STREP BY PCR: Group A Strep by PCR: NOT DETECTED

## 2020-07-23 LAB — RESP PANEL BY RT-PCR (FLU A&B, COVID) ARPGX2
Influenza A by PCR: NEGATIVE
Influenza B by PCR: NEGATIVE
SARS Coronavirus 2 by RT PCR: NEGATIVE

## 2020-07-23 MED ORDER — METHYLPREDNISOLONE SODIUM SUCC 125 MG IJ SOLR
125.0000 mg | Freq: Once | INTRAMUSCULAR | Status: AC
  Administered 2020-07-23: 125 mg via INTRAVENOUS
  Filled 2020-07-23: qty 2

## 2020-07-23 MED ORDER — IOHEXOL 300 MG/ML  SOLN
100.0000 mL | Freq: Once | INTRAMUSCULAR | Status: AC | PRN
  Administered 2020-07-23: 100 mL via INTRAVENOUS

## 2020-07-23 MED ORDER — CLINDAMYCIN PHOSPHATE 600 MG/50ML IV SOLN
600.0000 mg | Freq: Once | INTRAVENOUS | Status: AC
  Administered 2020-07-23: 600 mg via INTRAVENOUS
  Filled 2020-07-23: qty 50

## 2020-07-23 MED ORDER — CLINDAMYCIN HCL 300 MG PO CAPS: 600.0000 mg | ORAL_CAPSULE | Freq: Three times a day (TID) | ORAL | 0 refills | Status: DC

## 2020-07-23 MED ORDER — BENZOCAINE 20 % MT AERO
INHALATION_SPRAY | OROMUCOSAL | Status: AC
  Filled 2020-07-23: qty 57

## 2020-07-23 MED ORDER — PREDNISONE 20 MG PO TABS: 40.0000 mg | ORAL_TABLET | Freq: Every day | ORAL | 0 refills | Status: DC

## 2020-07-23 MED ORDER — SODIUM CHLORIDE 0.9 % IV SOLN
INTRAVENOUS | Status: DC | PRN
  Administered 2020-07-23: 1000 mL via INTRAVENOUS

## 2020-07-23 MED FILL — predniSONE 20 MG TABS: 20 | 5 days supply | Qty: 10 | Fill #0

## 2020-07-23 MED FILL — CLINDAMYCIN HCL 300 MG CAP: 300 | 10 days supply | Qty: 60 | Fill #0

## 2020-07-23 NOTE — ED Notes (Signed)
ED Provider at bedside. 

## 2020-07-23 NOTE — ED Provider Notes (Signed)
Patient signed out to me with peritonsillar abscess.  Awaiting phone call from ENT.  Has received steroids and clindamycin.  Talked with Dr. Irene Limbo with ENT who will see the patient in her office this afternoon.  Patient feeling more comfortable after steroids fluids antibiotics.  Prescriptions for clindamycin and steroids have been prescribed.  Given information to follow-up with ENT this afternoon.  This chart was dictated using voice recognition software.  Despite best efforts to proofread,  errors can occur which can change the documentation meaning.     Virgina Norfolk, DO 07/23/20 1142

## 2020-07-23 NOTE — ED Triage Notes (Signed)
Pt c/o swollen tonsils.

## 2020-07-23 NOTE — ED Notes (Signed)
Pt states URI starting wed, throat sore and swelling starting sun. Worse at night pt unable to sleep tonight due to pain and swelling

## 2020-07-23 NOTE — ED Provider Notes (Signed)
[facility] EMERGENCY DEPARTMENT Provider Note   CSN: [id number] Arrival date & time: 07/23/20  0524     History Chief Complaint  Patient presents with  . Sore Throat    Charles Kim is a 34 y.o. male.  Patient had a sinus infection last week that had progressively improved with OTC meds (mucinex, tylenol, ibuprofen, nyquil) but on Sunday started having a 'scratchy' throat but over last 12-24 hour started having hoarse voice, throat swelling, chills so comes here for further eval. Has had people at work with similar symptoms.    Sore Throat       Past Medical History:  Diagnosis Date  . Anal condyloma   . Condyloma acuminatum in male    penile, suprapubic, groin area's  . Hepatitis C    followed dr [facility] Kim w/ [facility]-- started harvoni 02-02-2017; 01-04-2017 per ultrasound FS some F3 + F4  . 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   Past Surgical History:  Procedure Laterality Date  . CO2 LASER APPLICATION N/A 03/11/2017   Procedure: CO2 LASER TREATMENT OF CONDYLOMA;  Surgeon: Ihor Gully, MD;  Location: [facility];  Service: Urology;  Laterality: N/A;  . LASER ABLATION CONDOLAMATA N/A 03/11/2017   Procedure: LASER ABLATION CONDYLOMA;  Surgeon: Romie Levee, MD;  Location: [facility];  Service: General;  Laterality: N/A;  . PERCUTANEOUS PINNING WRIST FRACTURE Left 2013       No family history on file.  Social History   Tobacco Use  . Smoking status: Former Smoker    Years: 15.00    Types: Cigarettes    Quit date: 09/01/2015    Years since quitting: 4.8  . Smokeless tobacco: Never Used  Vaping Use  . Vaping Use: Never used  Substance Use Topics  . Alcohol use: No  . Drug use: Yes    Comment: per pt quit Jan 2017 ( used opoid's, meth, heroin)    Home Medications Prior to Admission medications     Not on File    Allergies    Patient has no known allergies.  Review of Systems   Review of Systems  All other systems reviewed and are negative.   Physical Exam Updated Vital Signs BP (!) 152/87   Pulse 89   Temp 98.2 F (36.8 C) (Oral)   Resp 16   Ht 5\' 8"  (1.727 m)   Wt 93 kg   SpO2 97%   BMI 31.17 kg/m   Physical Exam Vitals and nursing note reviewed.  Constitutional:      Appearance: He is well-developed.  HENT:     Head: Normocephalic and atraumatic.     Mouth/Throat:     Mouth: Mucous membranes are dry.     Pharynx: Pharyngeal swelling, posterior oropharyngeal erythema and uvula swelling (uvular edema) present. No oropharyngeal exudate.     Tonsils: No tonsillar exudate. 4+ on the right.  Cardiovascular:     Rate and Rhythm: Normal rate.  Pulmonary:     Effort: Pulmonary effort is normal. No respiratory distress.  Abdominal:     General: There is no distension.  Musculoskeletal:        General: Normal range of motion.     Cervical back: Normal range of motion.  Skin:    General: Skin is warm and dry.  Neurological:  General: No focal deficit present.     Mental Status: He is alert.     ED Results / Procedures / Treatments   Labs (all labs ordered are listed, but only abnormal results are displayed) Labs Reviewed  GROUP A STREP BY PCR  CBC WITH DIFFERENTIAL/PLATELET  BASIC METABOLIC PANEL    EKG None  Radiology No results found.  Procedures Procedures (including critical care time)  Medications Ordered in ED Medications  Benzocaine (HURRCAINE) 20 % mouth spray (has no administration in time range)  methylPREDNISolone sodium succinate (SOLU-MEDROL) 125 mg/2 mL injection 125 mg (has no administration in time range)  clindamycin (CLEOCIN) IVPB 600 mg (has no administration in time range)    ED Course  I have reviewed the triage vital signs and the nursing notes.  Pertinent labs & imaging results that were available during my care  of the patient were reviewed by me and considered in my medical decision making (see chart for details).    MDM Rules/Calculators/A&P                         Significant uvular edema however also has a significant asymmetric right-sided peritonsillar edema.  No identified abscess.  Nothing I see is amenable to drainage at this time.  We will go and CT scan which was no deep-seated abscess.  Start clindamycin, prednisone and will likely need an ENT referral either way.  Ct w/ obvious fluid collection. Already had been treated w/ abx/ and steroids. ENT consult placed. Care transferred pending ENT discussion. Patient is stable for discharge if they can see today.    Final Clinical Impression(s) / ED Diagnoses Final diagnoses:  Peritonsillar abscess    Rx / DC Orders ED Discharge Orders    None       Charles Kim, Charles Cower, MD 07/28/20 2255

## 2021-10-05 ENCOUNTER — Emergency Department (HOSPITAL_BASED_OUTPATIENT_CLINIC_OR_DEPARTMENT_OTHER)
Admission: EM | Admit: 2021-10-05 | Discharge: 2021-10-05 | Disposition: A | Discharge status: Home / Self Care | Payer: 59 | Attending: Emergency Medicine | Admitting: Emergency Medicine

## 2021-10-05 ENCOUNTER — Other Ambulatory Visit: Payer: Self-pay

## 2021-10-05 ENCOUNTER — Encounter (HOSPITAL_BASED_OUTPATIENT_CLINIC_OR_DEPARTMENT_OTHER): Payer: Self-pay | Admitting: *Deleted

## 2021-10-05 ENCOUNTER — Emergency Department (HOSPITAL_BASED_OUTPATIENT_CLINIC_OR_DEPARTMENT_OTHER): Payer: 59

## 2021-10-05 DIAGNOSIS — S61210A Laceration without foreign body of right index finger without damage to nail, initial encounter: Secondary | ICD-10-CM | POA: Diagnosis not present

## 2021-10-05 DIAGNOSIS — W311XXA Contact with metalworking machines, initial encounter: Secondary | ICD-10-CM | POA: Insufficient documentation

## 2021-10-05 DIAGNOSIS — S6991XA Unspecified injury of right wrist, hand and finger(s), initial encounter: Secondary | ICD-10-CM | POA: Diagnosis present

## 2021-10-05 MED ORDER — LIDOCAINE HCL (PF) 1 % IJ SOLN
30.0000 mL | Freq: Once | INTRAMUSCULAR | Status: AC
  Administered 2021-10-05: 30 mL
  Filled 2021-10-05: qty 30

## 2021-10-05 MED ORDER — CEPHALEXIN 500 MG PO CAPS: 500.0000 mg | ORAL_CAPSULE | Freq: Three times a day (TID) | ORAL | 0 refills | Status: AC

## 2021-10-05 NOTE — ED Notes (Signed)
Pt A&OX4 ambulatory at d/c with independent steady gait. Pt verbalized understanding of d/c instructions, prescription and follow up care. 

## 2021-10-05 NOTE — ED Triage Notes (Signed)
Laceration to his left index finger on a grinder while working on his car. Bleeding controlled.

## 2021-10-05 NOTE — ED Provider Notes (Signed)
[facility] EMERGENCY DEPARTMENT Provider Note   CSN: [id number] Arrival date & time: 10/05/21  1752     History  Chief Complaint  Patient presents with   Laceration    Charles Kim is a 36 y.o. male.  Patient presents chief complaint of laceration to the right hand index finger radial aspect.  Linear laceration sustained while using a metal grinder.  Denies injury elsewhere.  No fever no cough no vomiting or diarrhea reported.      Home Medications Prior to Admission medications   Not on File      Allergies    Patient has no known allergies.    Review of Systems   Review of Systems  Constitutional:  Negative for fever.  HENT:  Negative for ear pain and sore throat.   Eyes:  Negative for pain.  Respiratory:  Negative for cough.   Cardiovascular:  Negative for chest pain.  Gastrointestinal:  Negative for abdominal pain.  Genitourinary:  Negative for flank pain.  Musculoskeletal:  Negative for back pain.  Skin:  Negative for color change and rash.  Neurological:  Negative for syncope.  All other systems reviewed and are negative.  Physical Exam Updated Vital Signs BP 136/89 (BP Location: Right Arm)    Pulse 82    Temp 97.9 F (36.6 C) (Oral)    Resp 16    Ht 5\' 8"  (1.727 m)    Wt 113.4 kg    SpO2 94%    BMI 38.01 kg/m  Physical Exam Constitutional:      Appearance: He is well-developed.  HENT:     Head: Normocephalic.     Nose: Nose normal.  Eyes:     Extraocular Movements: Extraocular movements intact.  Cardiovascular:     Rate and Rhythm: Normal rate.  Pulmonary:     Effort: Pulmonary effort is normal.  Skin:    Coloration: Skin is not jaundiced.     Comments: 5 cm linear laceration on the radial aspect of the right hand index finger proximal base.  Neurovascular intact otherwise extension flexion of MCP and PIP and DIP joints.  Sensation intact to both sides of the index finger.  Cap refill normal less than 2 seconds.  Neurological:      Mental Status: He is alert. Mental status is at baseline.     ED Results / Procedures / Treatments   Labs (all labs ordered are listed, but only abnormal results are displayed) Labs Reviewed - No data to display  EKG None  Radiology DG Finger Index Left  Result Date: 10/05/2021 CLINICAL DATA:  Left index finger laceration. EXAM: LEFT INDEX FINGER 2+V COMPARISON:  None. FINDINGS: Normal alignment. No acute fracture or dislocation. Surgical bandaging is seen radial to the PIP joint of the a index finger. Punctate, sub-mm radiopacities may represent debris along the skin surface or within the wound itself. IMPRESSION: No acute fracture or dislocation Electronically Signed   By: 12/03/2021 M.D.   On: 10/05/2021 18:58    Procedures .04/06/2023Laceration Repair  Date/Time: 10/05/2021 9:06 PM Performed by: 12/03/2021, MD Authorized by: Cheryll Cockayne, MD   Comments:     Wound irrigated with sterile saline under pressure.  Small capful of Betadine mixed with the saline.  Total of 500 cc applied.  No foreign body noted in a bloodless field.  X-ray shows no bony involvement.  Digital block performed.  4-0 Ethilon sutures placed total of 5 sutures placed with good approximation of  edges.  No active bleeding seen afterwards.  Bacitracin ointment placed and nonadhesive gauze and Coban dressing placed afterwards.  Advised patient not to flex his finger all the way to avoid popping the stitches.  Advise return in 10 days for suture removal.  Neurovascular intact after repair. Celedonio Miyamoto Block  Date/Time: 10/05/2021 9:07 PM Performed by: Cheryll Cockayne, MD Authorized by: Cheryll Cockayne, MD   Comments:     Digital block performed of the right index finger.  Site prepped with Betadine x3.  23-gauge needle used to inject 3 cc of lidocaine in the right index finger base on radial ulnar sides as well as across the top.  Patient tolerated the procedure well and good local anesthesia achieved.    Medications  Ordered in ED Medications  lidocaine (PF) (XYLOCAINE) 1 % injection 30 mL (has no administration in time range)    ED Course/ Medical Decision Making/ A&P                           Medical Decision Making Amount and/or Complexity of Data Reviewed Radiology: ordered.  Risk Prescription drug management.   Chart review shows office visit to primary care doctor's office September 02, 2021 for well exam.          Final Clinical Impression(s) / ED Diagnoses Final diagnoses:  Laceration of right index finger without foreign body without damage to nail, initial encounter    Rx / DC Orders ED Discharge Orders     None         Cheryll Cockayne, MD 10/05/21 2108

## 2021-10-05 NOTE — Discharge Instructions (Signed)
Keep the wound dry for 2 days.  You may wash gently with soap and water after that.  Keep the wound covered for 1 week.  Apply bacitracin dressing daily and change dressing daily.  Return in 10 days for suture removal.  However if you see increased swelling increased pain increased redness or fevers, return immediately.

## 2021-10-05 NOTE — ED Notes (Signed)
Laceration to left index finger, bleeding controlled

## 2021-11-20 ENCOUNTER — Emergency Department (HOSPITAL_COMMUNITY)
Admission: EM | Admit: 2021-11-20 | Discharge: 2021-11-20 | Disposition: A | Discharge status: Home / Self Care | Payer: 59 | Attending: Emergency Medicine | Admitting: Emergency Medicine

## 2021-11-20 ENCOUNTER — Emergency Department (HOSPITAL_COMMUNITY): Payer: 59

## 2021-11-20 DIAGNOSIS — R200 Anesthesia of skin: Secondary | ICD-10-CM | POA: Diagnosis not present

## 2021-11-20 DIAGNOSIS — S60512A Abrasion of left hand, initial encounter: Secondary | ICD-10-CM | POA: Diagnosis not present

## 2021-11-20 DIAGNOSIS — S3991XA Unspecified injury of abdomen, initial encounter: Secondary | ICD-10-CM | POA: Diagnosis present

## 2021-11-20 DIAGNOSIS — S31119A Laceration without foreign body of abdominal wall, unspecified quadrant without penetration into peritoneal cavity, initial encounter: Secondary | ICD-10-CM | POA: Insufficient documentation

## 2021-11-20 DIAGNOSIS — S32009A Unspecified fracture of unspecified lumbar vertebra, initial encounter for closed fracture: Secondary | ICD-10-CM | POA: Diagnosis not present

## 2021-11-20 DIAGNOSIS — Y92410 Unspecified street and highway as the place of occurrence of the external cause: Secondary | ICD-10-CM | POA: Diagnosis not present

## 2021-11-20 LAB — COMPREHENSIVE METABOLIC PANEL
ALT: 32 U/L (ref 0–44)
AST: 29 U/L (ref 15–41)
Albumin: 3.9 g/dL (ref 3.5–5.0)
Alkaline Phosphatase: 59 U/L (ref 38–126)
Anion gap: 10 (ref 5–15)
BUN: 16 mg/dL (ref 6–20)
CO2: 21 mmol/L — ABNORMAL LOW (ref 22–32)
Calcium: 8.9 mg/dL (ref 8.9–10.3)
Chloride: 106 mmol/L (ref 98–111)
Creatinine, Ser: 1.02 mg/dL (ref 0.61–1.24)
GFR, Estimated: >60 mL/min (ref >60)
Glucose, Bld: 100 mg/dL — ABNORMAL HIGH (ref 70–99)
Potassium: 3.5 mmol/L (ref 3.5–5.1)
Sodium: 137 mmol/L (ref 135–145)
Total Bilirubin: 0.8 mg/dL (ref 0.3–1.2)
Total Protein: 7 g/dL (ref 6.5–8.1)

## 2021-11-20 LAB — CBC
HCT: 42.8 % (ref 39.0–52.0)
Hemoglobin: 14.8 g/dL (ref 13.0–17.0)
MCH: 30 pg (ref 26.0–34.0)
MCHC: 34.6 g/dL (ref 30.0–36.0)
MCV: 86.8 fL (ref 80.0–100.0)
Platelets: 222 10*3/uL (ref 150–400)
RBC: 4.93 MIL/uL (ref 4.22–5.81)
RDW: 13.2 % (ref 11.5–15.5)
WBC: 10.9 10*3/uL — ABNORMAL HIGH (ref 4.0–10.5)
nRBC: 0 % (ref 0.0–0.2)

## 2021-11-20 LAB — ETHANOL: Alcohol, Ethyl (B): <10 mg/dL (ref <10)

## 2021-11-20 LAB — I-STAT CHEM 8, ED
BUN: 16 mg/dL (ref 6–20)
Calcium, Ion: 1.05 mmol/L — ABNORMAL LOW (ref 1.15–1.40)
Chloride: 107 mmol/L (ref 98–111)
Creatinine, Ser: 1 mg/dL (ref 0.61–1.24)
Glucose, Bld: 97 mg/dL (ref 70–99)
HCT: 43 % (ref 39.0–52.0)
Hemoglobin: 14.6 g/dL (ref 13.0–17.0)
Potassium: 3.5 mmol/L (ref 3.5–5.1)
Sodium: 140 mmol/L (ref 135–145)
TCO2: 23 mmol/L (ref 22–32)

## 2021-11-20 LAB — LACTIC ACID, PLASMA: Lactic Acid, Venous: 1.6 mmol/L (ref 0.5–1.9)

## 2021-11-20 LAB — PROTIME-INR
INR: 1 (ref 0.8–1.2)
Prothrombin Time: 13.1 seconds (ref 11.4–15.2)

## 2021-11-20 MED ORDER — ACETAMINOPHEN 500 MG PO TABS
1000.0000 mg | ORAL_TABLET | Freq: Once | ORAL | Status: AC
  Administered 2021-11-20: 1000 mg via ORAL
  Filled 2021-11-20: qty 2

## 2021-11-20 MED ORDER — ONDANSETRON HCL 4 MG/2ML IJ SOLN
4.0000 mg | Freq: Once | INTRAMUSCULAR | Status: AC
  Administered 2021-11-20: 4 mg via INTRAVENOUS
  Filled 2021-11-20: qty 2

## 2021-11-20 MED ORDER — ONDANSETRON 4 MG PO TBDP: 4.0000 mg | ORAL_TABLET | Freq: Three times a day (TID) | ORAL | 0 refills | Status: AC | PRN

## 2021-11-20 MED ORDER — LIDOCAINE-EPINEPHRINE 2 %-1:200000 IJ SOLN
10.0000 mL | Freq: Once | INTRAMUSCULAR | Status: AC
Start: 2021-11-20 — End: 2021-11-20
  Administered 2021-11-20: 10 mL via INTRADERMAL
  Filled 2021-11-20: qty 20

## 2021-11-20 MED ORDER — BACITRACIN ZINC 500 UNIT/GM EX OINT: 1.0000 "application " | TOPICAL_OINTMENT | Freq: Two times a day (BID) | CUTANEOUS | 0 refills | Status: AC

## 2021-11-20 MED ORDER — LIDOCAINE-EPINEPHRINE-TETRACAINE (LET) TOPICAL GEL
3.0000 mL | Freq: Once | TOPICAL | Status: AC
  Administered 2021-11-20: 3 mL via TOPICAL
  Filled 2021-11-20: qty 3

## 2021-11-20 MED ORDER — IOHEXOL 350 MG/ML SOLN
100.0000 mL | Freq: Once | INTRAVENOUS | Status: AC | PRN
  Administered 2021-11-20: 100 mL via INTRAVENOUS

## 2021-11-20 MED ORDER — KETOROLAC TROMETHAMINE 15 MG/ML IJ SOLN
15.0000 mg | Freq: Once | INTRAMUSCULAR | Status: AC
  Administered 2021-11-20: 15 mg via INTRAVENOUS
  Filled 2021-11-20: qty 1

## 2021-11-20 MED ORDER — IBUPROFEN 800 MG PO TABS: 800.0000 mg | ORAL_TABLET | Freq: Three times a day (TID) | ORAL | 0 refills | Status: AC | PRN

## 2021-11-20 MED ORDER — LIDOCAINE 5 % EX OINT: 1.0000 "application " | TOPICAL_OINTMENT | Freq: Four times a day (QID) | CUTANEOUS | 0 refills | Status: AC | PRN

## 2021-11-20 NOTE — Discharge Instructions (Addendum)
If you develop worsening, recurrent, or continued back pain, numbness or weakness in the legs, incontinence of your bowels or bladders, numbness of your buttocks, fever, abdominal pain, or any other new/concerning symptoms then return to the ER for evaluation.  

## 2021-11-20 NOTE — Progress Notes (Signed)
Orthopedic Tech Progress Note ?Patient Details:  ?Charles Kim ?1985-12-18 ?[id number] ? ?Ortho Devices ?Type of Ortho Device: Knee Immobilizer, Crutches ?Ortho Device/Splint Location: rle ?Ortho Device/Splint Interventions: Ordered, Application, Adjustment ?  ?Post Interventions ?Patient Tolerated: Well ?Instructions Provided: Care of device, Adjustment of device ? ?Trinna Post ?11/20/2021, 11:07 PM ? ?
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
